# Patient Record
Sex: Female | Born: 1970 | Race: White | Hispanic: No | Marital: Married | Smoking: Never smoker
Health system: Southern US, Community
[De-identification: ages and names within clinical notes are randomized; demographics above are authoritative.]

## PROBLEM LIST (undated history)

## (undated) HISTORY — PX: ABDOMINAL HYSTERECTOMY: SHX81

## (undated) HISTORY — PX: BLADDER SUSPENSION: SHX72

## (undated) HISTORY — PX: CHOLECYSTECTOMY: SHX55

---

## 2014-05-02 ENCOUNTER — Emergency Department (HOSPITAL_BASED_OUTPATIENT_CLINIC_OR_DEPARTMENT_OTHER)
Admission: EM | Admit: 2014-05-02 | Discharge: 2014-05-02 | Disposition: A | Discharge status: Home / Self Care | Payer: 59 | Attending: Emergency Medicine | Admitting: Emergency Medicine

## 2014-05-02 ENCOUNTER — Encounter (HOSPITAL_BASED_OUTPATIENT_CLINIC_OR_DEPARTMENT_OTHER): Payer: Self-pay | Admitting: *Deleted

## 2014-05-02 ENCOUNTER — Emergency Department (HOSPITAL_BASED_OUTPATIENT_CLINIC_OR_DEPARTMENT_OTHER): Payer: 59

## 2014-05-02 DIAGNOSIS — Z8709 Personal history of other diseases of the respiratory system: Secondary | ICD-10-CM | POA: Diagnosis not present

## 2014-05-02 DIAGNOSIS — R42 Dizziness and giddiness: Secondary | ICD-10-CM | POA: Insufficient documentation

## 2014-05-02 DIAGNOSIS — R51 Headache: Secondary | ICD-10-CM | POA: Insufficient documentation

## 2014-05-02 DIAGNOSIS — R519 Headache, unspecified: Secondary | ICD-10-CM

## 2014-05-02 DIAGNOSIS — R110 Nausea: Secondary | ICD-10-CM | POA: Diagnosis not present

## 2014-05-02 MED ORDER — DEXAMETHASONE SODIUM PHOSPHATE 10 MG/ML IJ SOLN
10.0000 mg | Freq: Once | INTRAMUSCULAR | Status: AC
  Administered 2014-05-02: 10 mg via INTRAVENOUS
  Filled 2014-05-02: qty 1

## 2014-05-02 MED ORDER — METOCLOPRAMIDE HCL 5 MG/ML IJ SOLN
10.0000 mg | Freq: Once | INTRAMUSCULAR | Status: AC
  Administered 2014-05-02: 10 mg via INTRAVENOUS
  Filled 2014-05-02: qty 2

## 2014-05-02 MED ORDER — KETOROLAC TROMETHAMINE 60 MG/2ML IM SOLN
60.0000 mg | Freq: Once | INTRAMUSCULAR | Status: AC
  Administered 2014-05-02: 60 mg via INTRAMUSCULAR
  Filled 2014-05-02: qty 2

## 2014-05-02 MED ORDER — DIPHENHYDRAMINE HCL 50 MG/ML IJ SOLN
25.0000 mg | Freq: Once | INTRAMUSCULAR | Status: AC
  Administered 2014-05-02: 25 mg via INTRAVENOUS
  Filled 2014-05-02: qty 1

## 2014-05-02 NOTE — ED Notes (Signed)
Alert, NAD, calm, interactive, resps e/u, speech clear.

## 2014-05-02 NOTE — ED Notes (Signed)
Attempt x 2 to start IV unsuccessful. 

## 2014-05-02 NOTE — ED Provider Notes (Signed)
CSN: [id number]     Arrival date & time 05/02/14  1740 History   First MD Initiated Contact with Patient 05/02/14 1958     This chart was scribed for Rolan BuccoMelanie Darthula Desa, MD by Arlan OrganAshley Leger, ED Scribe. This patient was seen in room MH11/MH11 and the patient's care was started 10:47 PM.    Chief Complaint  Patient presents with  . Headache   Patient is a 43 y.o. female presenting with headaches.  Headache Pain location:  Generalized Radiates to:  Does not radiate Onset quality:  Gradual Duration:  3 days Timing:  Constant Progression:  Unchanged Chronicity:  New Relieved by:  Nothing Worsened by:  Nothing tried Ineffective treatments:  Prescription medications Associated symptoms: nausea   Associated symptoms: no abdominal pain, no back pain, no congestion, no cough, no diarrhea, no dizziness, no fatigue, no fever, no numbness and no vomiting     HPI Comments: Kathyrn SheriffDelisa Kukla is a 43 y.o. female who presents to the Emergency Department complaining of a constant, moderate, gradual onset HA x 3 days that is unchanged. HA is described as pressure. No previous history of HAs in the past (although per PCP notes, she has chronic daily headaches). She also reports mild dizziness and nausea. States "i just don't feel right".  Ms. Judithann SheenSparks has tried OTC Cold Advil without any improvement for symptoms. Pt was seen by her PCP yesterday and started on Nasonex, Amoxicillin, and Tramadol for sinus infection. No recent congestion, cough, fever, chills, or weakness. No recent injury, falls, or trauma. She denies any new medication use prior to onset of symptoms. LNMP 2007. No known allergies to medications.   History reviewed. No pertinent past medical history. Past Surgical History  Procedure Laterality Date  . Abdominal hysterectomy    . Cholecystectomy    . Bladder suspension     No family history on file. History  Substance Use Topics  . Smoking status: Never Smoker   . Smokeless tobacco: Not on file   . Alcohol Use: No   OB History    No data available     Review of Systems  Constitutional: Negative for fever, chills, diaphoresis and fatigue.  HENT: Negative for congestion, rhinorrhea and sneezing.   Eyes: Negative.   Respiratory: Negative for cough, chest tightness and shortness of breath.   Cardiovascular: Negative for chest pain and leg swelling.  Gastrointestinal: Positive for nausea. Negative for vomiting, abdominal pain, diarrhea and blood in stool.  Genitourinary: Negative for frequency, hematuria, flank pain and difficulty urinating.  Musculoskeletal: Negative for back pain and arthralgias.  Skin: Negative for rash.  Neurological: Positive for light-headedness and headaches. Negative for dizziness, speech difficulty, weakness and numbness.      Allergies  Review of patient's allergies indicates no known allergies.  Home Medications   Prior to Admission medications   Not on File   BP 120/72 mmHg  Pulse 79  Temp(Src) 98.2 F (36.8 C) (Oral)  Resp 20  Ht 5\' 4"  (1.626 m)  Wt 142 lb (64.411 kg)  BMI 24.36 kg/m2  SpO2 100% Physical Exam  Constitutional: She is oriented to person, place, and time. She appears well-developed and well-nourished.  HENT:  Head: Normocephalic and atraumatic.  Eyes: Pupils are equal, round, and reactive to light.  Neck: Normal range of motion. Neck supple.  Cardiovascular: Normal rate, regular rhythm and normal heart sounds.   Pulmonary/Chest: Effort normal and breath sounds normal. No respiratory distress. She has no wheezes. She has no rales. She  exhibits no tenderness.  Abdominal: Soft. Bowel sounds are normal. There is no tenderness. There is no rebound and no guarding.  Musculoskeletal: Normal range of motion. She exhibits no edema.  Lymphadenopathy:    She has no cervical adenopathy.  Neurological: She is alert and oriented to person, place, and time. She has normal strength. No cranial nerve deficit or sensory deficit. GCS eye  subscore is 4. GCS verbal subscore is 5. GCS motor subscore is 6.  Finger to nose intact, gait normal  Skin: Skin is warm and dry. No rash noted.  Psychiatric: She has a normal mood and affect.    ED Course  Procedures (including critical care time)  DIAGNOSTIC STUDIES: Oxygen Saturation is 100% on RA, Normal by my interpretation.    COORDINATION OF CARE: 10:47 PM-Discussed treatment plan with pt at bedside and pt agreed to plan.     Labs Review Labs Reviewed - No data to display  Imaging Review Ct Head Wo Contrast  05/02/2014   CLINICAL DATA:  Initial encounter for 4 day history of headaches and dizziness  EXAM: CT HEAD WITHOUT CONTRAST  TECHNIQUE: Contiguous axial images were obtained from the base of the skull through the vertex without intravenous contrast.  COMPARISON:  None.  FINDINGS: There is no evidence for acute hemorrhage, hydrocephalus, mass lesion, or abnormal extra-axial fluid collection. No definite CT evidence for acute infarction. The visualized paranasal sinuses and mastoid air cells are clear.  IMPRESSION: Normal exam.   Electronically Signed   By: Kennith CenterEric  Mansell M.D.   On: 05/02/2014 21:30     EKG Interpretation None      MDM   Final diagnoses:  Bad headache    Patient is given a migraine cocktail and Toradol. She's feeling better after that and is ready to go home. She has no neck pain or signs of meningitis/SAH. She's otherwise well-appearing with no neurologic deficits. She was discharged home in good condition was encouraged to follow-up with her primary care physician if her headache is not improving.  I personally performed the services described in this documentation, which was scribed in my presence. The recorded information has been reviewed and is accurate.    Rolan BuccoMelanie Davie Sagona, MD 05/02/14 [phone]

## 2014-05-02 NOTE — ED Notes (Addendum)
Head pressure x 3 days. She was seen by her MD yesterday and treated for sinusitis with Nasonex, Amoxicillin and Tramadol.

## 2014-05-02 NOTE — Discharge Instructions (Signed)

## 2014-05-02 NOTE — ED Notes (Signed)
1 attempt at IV - unable to flush. The patient requesting Meds per IM

## 2020-09-28 ENCOUNTER — Other Ambulatory Visit: Payer: Self-pay

## 2020-09-28 ENCOUNTER — Encounter (HOSPITAL_BASED_OUTPATIENT_CLINIC_OR_DEPARTMENT_OTHER): Payer: Self-pay

## 2020-09-28 ENCOUNTER — Emergency Department (HOSPITAL_BASED_OUTPATIENT_CLINIC_OR_DEPARTMENT_OTHER): Payer: BLUE CROSS/BLUE SHIELD

## 2020-09-28 ENCOUNTER — Emergency Department (HOSPITAL_BASED_OUTPATIENT_CLINIC_OR_DEPARTMENT_OTHER)
Admission: EM | Admit: 2020-09-28 | Discharge: 2020-09-28 | Disposition: A | Discharge status: Home / Self Care | Payer: BLUE CROSS/BLUE SHIELD | Attending: Emergency Medicine | Admitting: Emergency Medicine

## 2020-09-28 ENCOUNTER — Telehealth (HOSPITAL_BASED_OUTPATIENT_CLINIC_OR_DEPARTMENT_OTHER): Payer: Self-pay | Admitting: Emergency Medicine

## 2020-09-28 DIAGNOSIS — S8392XA Sprain of unspecified site of left knee, initial encounter: Secondary | ICD-10-CM | POA: Insufficient documentation

## 2020-09-28 DIAGNOSIS — Y9389 Activity, other specified: Secondary | ICD-10-CM | POA: Diagnosis not present

## 2020-09-28 DIAGNOSIS — S8992XA Unspecified injury of left lower leg, initial encounter: Secondary | ICD-10-CM

## 2020-09-28 DIAGNOSIS — X500XXA Overexertion from strenuous movement or load, initial encounter: Secondary | ICD-10-CM | POA: Insufficient documentation

## 2020-09-28 MED ORDER — HYDROCODONE-ACETAMINOPHEN 5-325 MG PO TABS
1.0000 | ORAL_TABLET | Freq: Once | ORAL | Status: AC
Start: 2020-09-28 — End: 2020-09-28
  Administered 2020-09-28: 1 via ORAL
  Filled 2020-09-28: qty 1

## 2020-09-28 MED ORDER — KETOROLAC TROMETHAMINE 60 MG/2ML IM SOLN
60.0000 mg | Freq: Once | INTRAMUSCULAR | Status: AC
  Administered 2020-09-28: 60 mg via INTRAMUSCULAR
  Filled 2020-09-28: qty 2

## 2020-09-28 MED ORDER — OXYCODONE HCL 5 MG PO TABS: 5.0000 mg | ORAL_TABLET | ORAL | 0 refills | Status: DC | PRN

## 2020-09-28 MED ORDER — OXYCODONE HCL 5 MG PO CAPS: 5.0000 mg | ORAL_CAPSULE | ORAL | 0 refills | Status: AC | PRN

## 2020-09-28 MED ORDER — LIDOCAINE 5 % EX PTCH: 1.0000 | MEDICATED_PATCH | CUTANEOUS | 0 refills | Status: AC

## 2020-09-28 NOTE — ED Triage Notes (Signed)
Pt injured her left knee last night doing a tik tok move with her friend. Unsure of mechanism of injury, swelling noted. Took muscle relaxer without relief. Did not take other pain meds.

## 2020-09-28 NOTE — ED Provider Notes (Signed)
[facility] EMERGENCY DEPARTMENT Provider Note   CSN: [id number] Arrival date & time: 09/28/20  0845     History Chief Complaint  Patient presents with  . Knee Injury    Kelsey Robertson is a 50 y.o. female.  HPI      50 year old female presents with concern for severe left knee pain sustained while trying to perform a TikTok move last night.  Reports that she was lifted on top of her friend, and began to fall, and thinks she may have twisted her knee.  She did not land directly on it.  Reports that initially she was able to walk on it, but this morning was not.  Reports severe pain.  Denies numbness, weakness.  Denies any other injuries.  Has not taken anything for it except a muscle relaxant.  History reviewed. No pertinent past medical history.  There are no problems to display for this patient.   Past Surgical History:  Procedure Laterality Date  . ABDOMINAL HYSTERECTOMY    . BLADDER SUSPENSION    . CHOLECYSTECTOMY       OB History   No obstetric history on file.     History reviewed. No pertinent family history.  Social History   Tobacco Use  . Smoking status: Never Smoker  Vaping Use  . Vaping Use: Never used  Substance Use Topics  . Alcohol use: Yes    Comment: socially  . Drug use: No    Home Medications Prior to Admission medications   Medication Sig Start Date End Date Taking? Authorizing Provider  lidocaine (LIDODERM) 5 % Place 1 patch onto the skin daily. Remove & Discard patch within 12 hours or as directed by MD 09/28/20  Yes Alvira Monday, MD  methocarbamol (ROBAXIN) 500 MG tablet Take by mouth daily as needed. 09/22/20  Yes [provider]  oxycodone (OXY-IR) 5 MG capsule Take 1 capsule (5 mg total) by mouth every 4 (four) hours as needed. 09/28/20   Alvira Monday, MD    Allergies    Patient has no known allergies.  Review of Systems   Review of Systems  Constitutional: Negative for fever.  Musculoskeletal: Positive  for arthralgias and gait problem. Negative for back pain and neck pain.  Skin: Negative for rash and wound.  Neurological: Negative for syncope and headaches.    Physical Exam Updated Vital Signs BP (!) 154/77   Pulse 62   Temp 98.2 F (36.8 C) (Oral)   Resp 18   Ht 5\' 4"  (1.626 m)   Wt 63.5 kg   SpO2 100%   BMI 24.03 kg/m   Physical Exam Vitals and nursing note reviewed.  Constitutional:      General: She is not in acute distress.    Appearance: Normal appearance. She is not ill-appearing, toxic-appearing or diaphoretic.  HENT:     Head: Normocephalic.  Eyes:     Conjunctiva/sclera: Conjunctivae normal.  Cardiovascular:     Rate and Rhythm: Normal rate and regular rhythm.     Pulses: Normal pulses.  Pulmonary:     Effort: Pulmonary effort is normal. No respiratory distress.  Musculoskeletal:        General: Swelling (left knee) and tenderness present. No deformity.     Cervical back: No rigidity.     Comments: Able to flex and extend knee but with pain Swelling present No clear abnormalities ant/posterior drawer, or lateral laxity  Skin:    General: Skin is warm and dry.  Coloration: Skin is not jaundiced or pale.  Neurological:     General: No focal deficit present.     Mental Status: She is alert and oriented to person, place, and time.     ED Results / Procedures / Treatments   Labs (all labs ordered are listed, but only abnormal results are displayed) Labs Reviewed - No data to display  EKG None  Radiology DG Knee Complete 4 Views Left  Result Date: 09/28/2020 CLINICAL DATA:  50 year old female with left knee pain. EXAM: LEFT KNEE - COMPLETE 4+ VIEW COMPARISON:  None. FINDINGS: No evidence of fracture, dislocation, or joint effusion. No evidence of arthropathy or other focal bone abnormality. Soft tissues are unremarkable. IMPRESSION: No acute fracture or malalignment. Electronically Signed   By: Marliss Coots MD   On: 09/28/2020 09:40     Procedures Procedures   Medications Ordered in ED Medications  ketorolac (TORADOL) injection 60 mg (60 mg Intramuscular Given 09/28/20 1038)  HYDROcodone-acetaminophen (NORCO/VICODIN) 5-325 MG per tablet 1 tablet (1 tablet Oral Given 09/28/20 1037)    ED Course  I have reviewed the triage vital signs and the nursing notes.  Pertinent labs & imaging results that were available during my care of the patient were reviewed by me and considered in my medical decision making (see chart for details).    MDM Rules/Calculators/A&P                          50 year old female presents with concern for severe left knee pain sustained while trying to perform a TikTok move last night.  X-ray obtained shows no evidence of fracture or malalignment.  Low suspicion for occult fracture by mechanism.  Suspect likely ligamentous or meniscal injury.  No sign of patellar tendon rupture.  Given noted swelling and significant pain, placed in knee immobilizer, recommend crutches and weightbearing as tolerated and follow-up with orthopedics.  Given short prescription for narcotic medications, discussed using other medications primarily. Reviewed in [place] drug database and discussed risks.   Final Clinical Impression(s) / ED Diagnoses Final diagnoses:  Injury of left knee, initial encounter  Sprain of left knee, unspecified ligament, initial encounter    Rx / DC Orders ED Discharge Orders         Ordered    lidocaine (LIDODERM) 5 %  Every 24 hours        09/28/20 1021    oxyCODONE (ROXICODONE) 5 MG immediate release tablet  Every 4 hours PRN,   Status:  Discontinued        09/28/20 1021           Alvira Monday, MD 09/30/20 [phone]

## 2020-09-28 NOTE — Discharge Instructions (Addendum)
You may take Tylenol (acetaminophen)1000 mg 4 times a day for 1 week. This is the maximum dose of Tylenol you can take from all sources. Please check other over-the-counter medications and prescriptions to ensure you are not taking other medications that contain acetaminophen.  You may also take ibuprofen 400 mg 6 times a day alternating with or at the same time as tylenol.  Take oxycodone as needed for breakthrough pain.  This medication can be addicting, sedating and cause constipation.   

## 2022-05-25 ENCOUNTER — Encounter: Payer: Self-pay | Admitting: Neurology

## 2022-06-20 IMAGING — DX DG KNEE COMPLETE 4+V*L*
4 series · 4 of 4 positions shown · non-contrast
Comparison: None.

CLINICAL DATA: 50-year-old female with left knee pain.

EXAM:
LEFT KNEE - COMPLETE 4+ VIEW

[knee ap (1 of 3)]
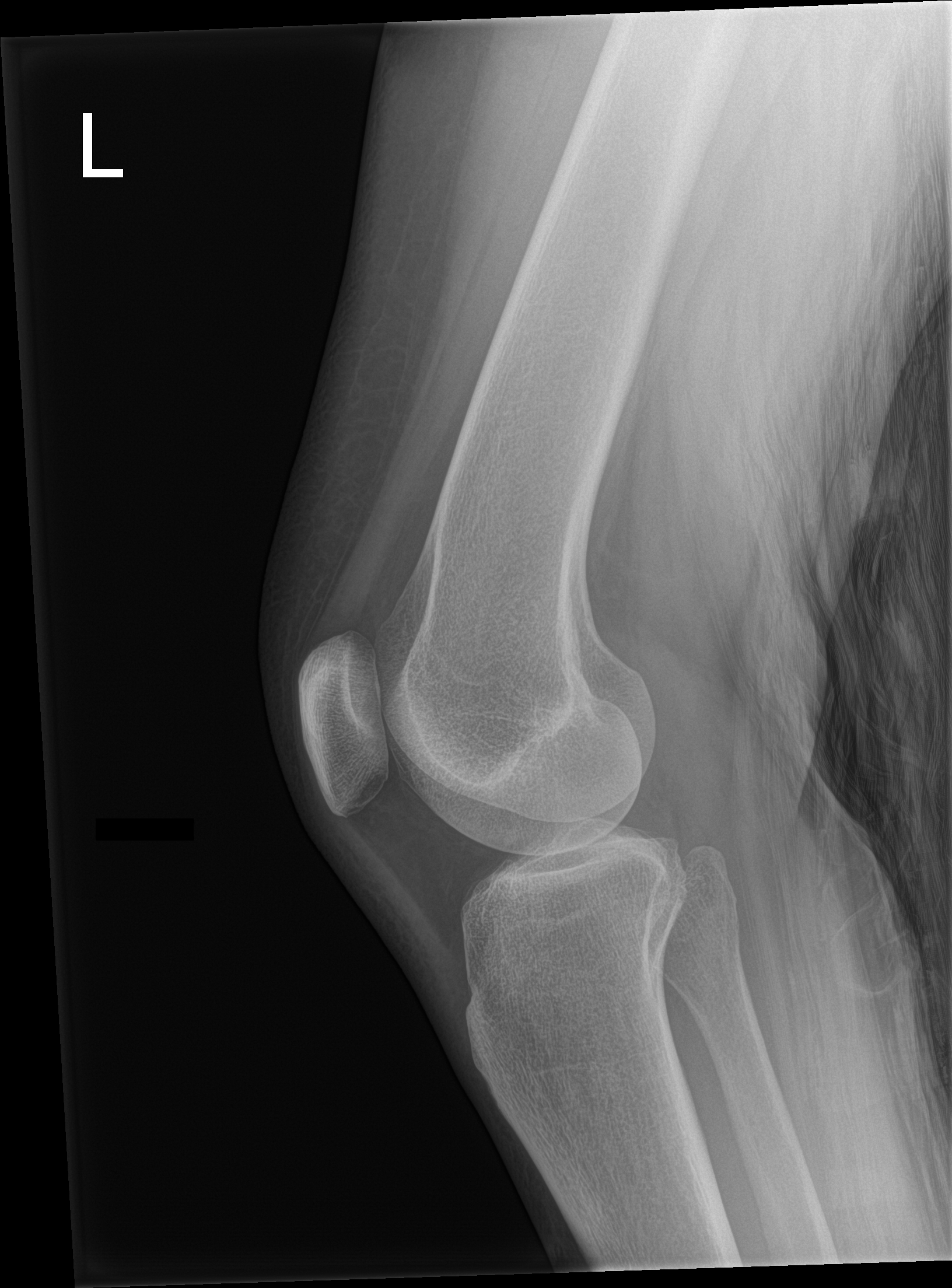

[knee obl]
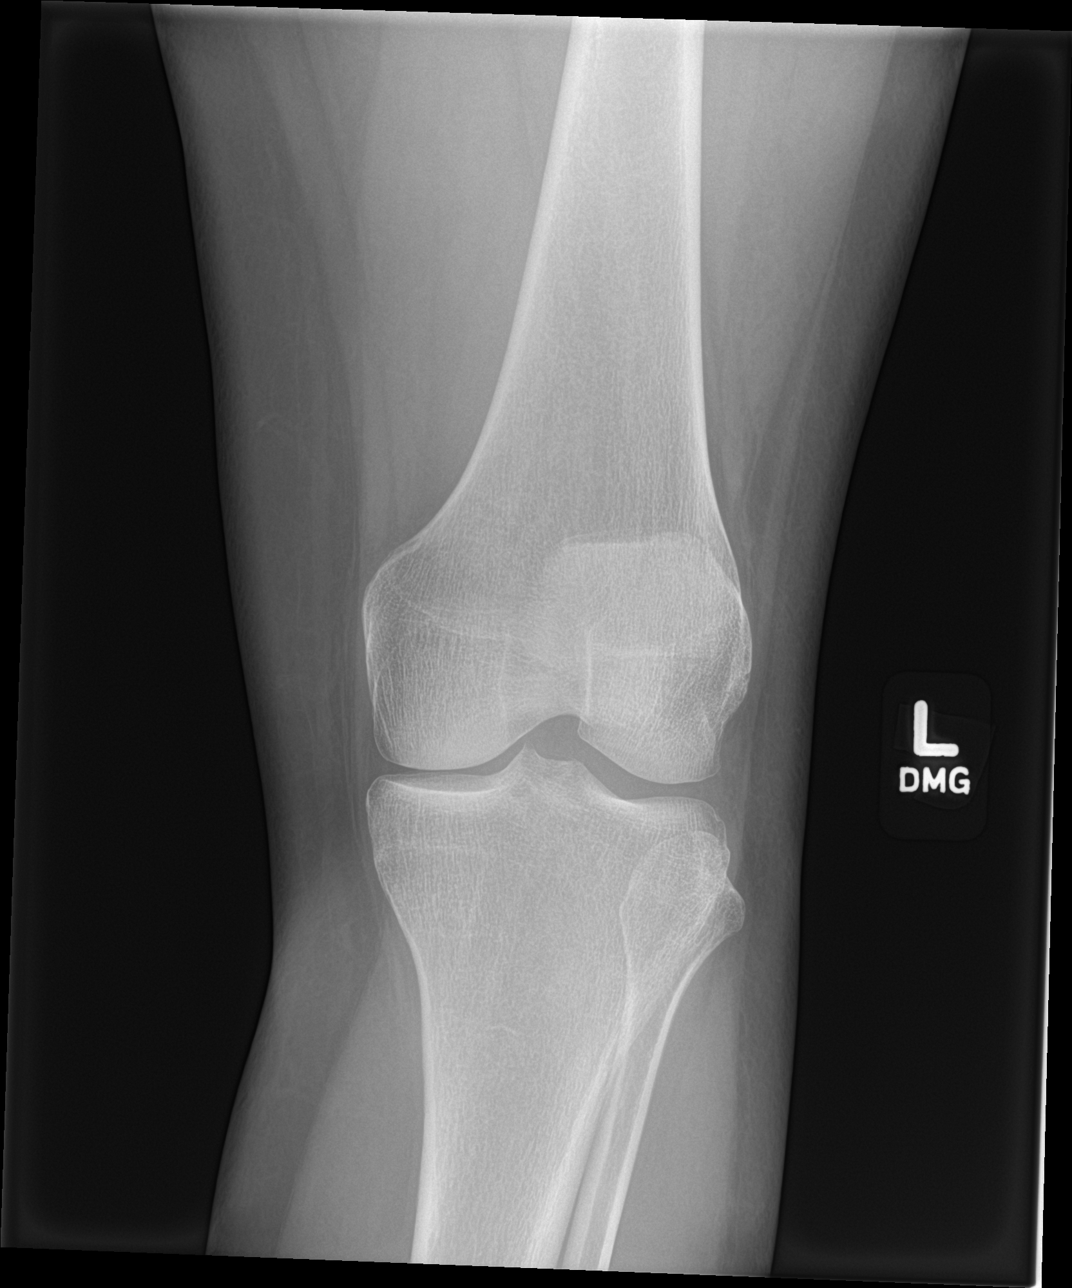

[knee ap (2 of 3)]
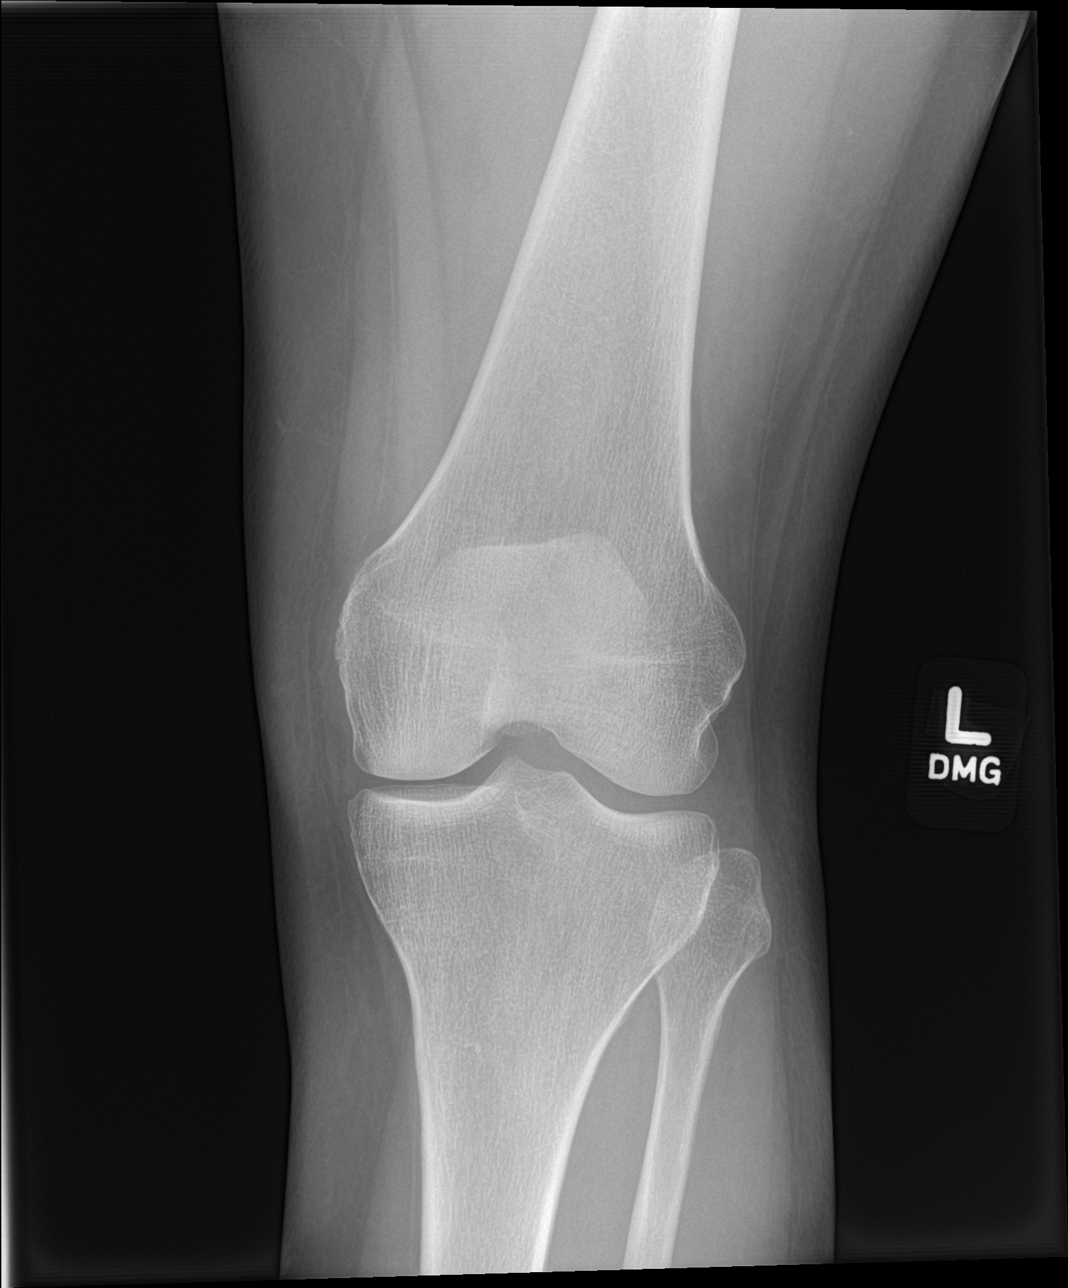

[knee ap (3 of 3)]
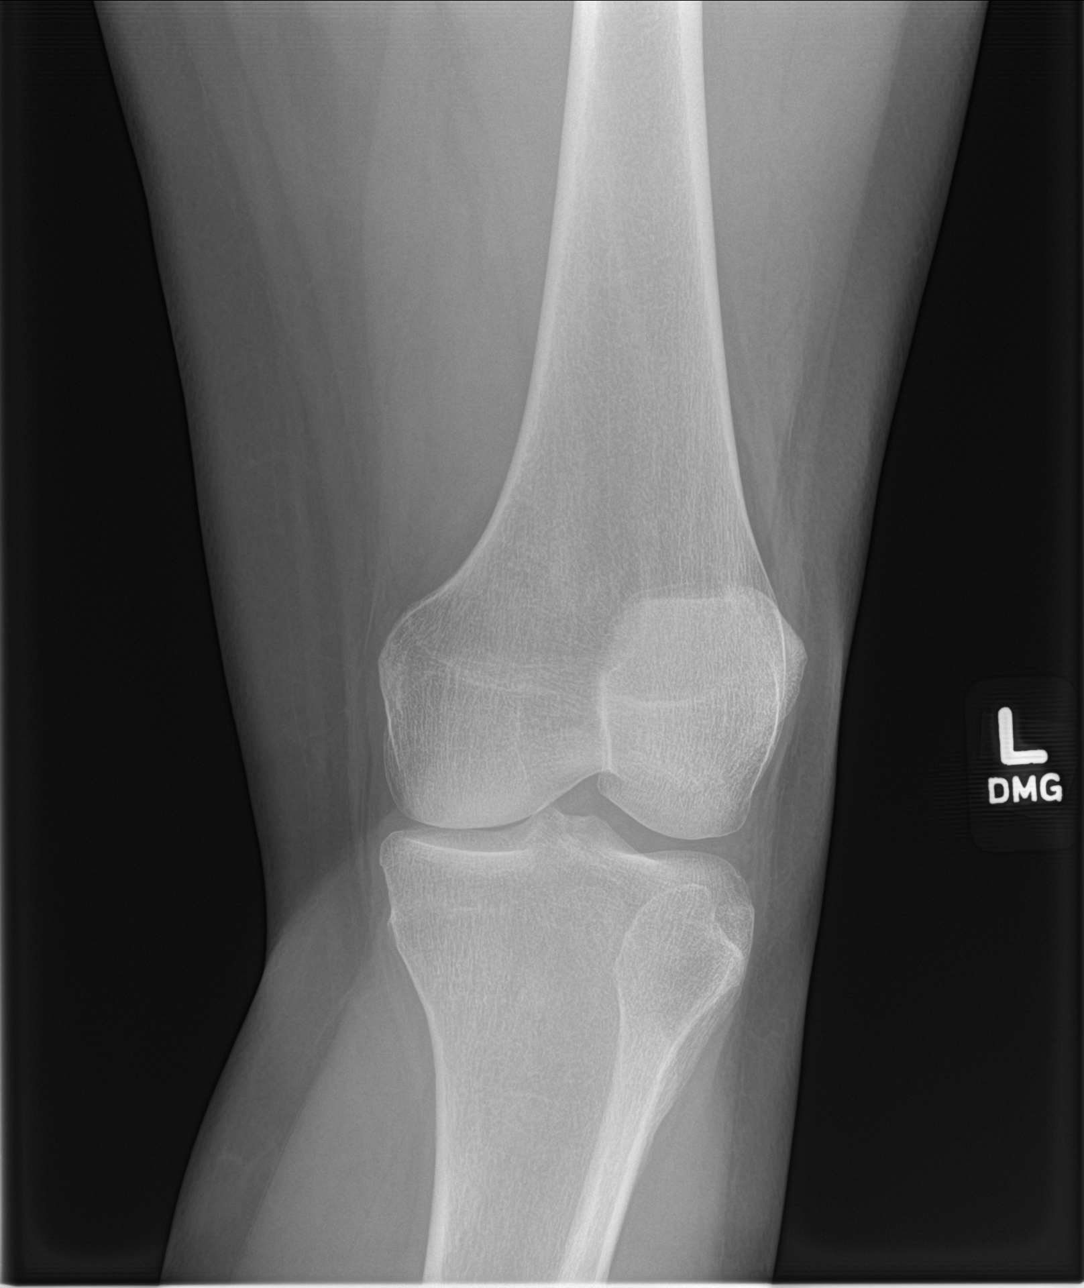

[4 of 4 positions shown; findings below may reference images not displayed]

FINDINGS: No evidence of fracture, dislocation, or joint effusion. No evidence
of arthropathy or other focal bone abnormality. Soft tissues are
unremarkable.
IMPRESSION: No acute fracture or malalignment.

## 2022-09-21 ENCOUNTER — Ambulatory Visit: Payer: BLUE CROSS/BLUE SHIELD | Admitting: Neurology
